# Patient Record
Sex: Female | Born: 1990 | Race: Asian | Hispanic: No | Marital: Single | Smoking: Never smoker
Health system: Southern US, Community
[De-identification: ages and names within clinical notes are randomized; demographics above are authoritative.]

---
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
## 2014-08-04 ENCOUNTER — Emergency Department (HOSPITAL_BASED_OUTPATIENT_CLINIC_OR_DEPARTMENT_OTHER)
Admission: EM | Admit: 2014-08-04 | Discharge: 2014-08-04 | Disposition: A | Discharge status: Home / Self Care | Payer: BC Managed Care – PPO | Attending: Emergency Medicine | Admitting: Emergency Medicine

## 2014-08-04 ENCOUNTER — Emergency Department (HOSPITAL_BASED_OUTPATIENT_CLINIC_OR_DEPARTMENT_OTHER): Payer: BC Managed Care – PPO

## 2014-08-04 ENCOUNTER — Encounter (HOSPITAL_BASED_OUTPATIENT_CLINIC_OR_DEPARTMENT_OTHER): Payer: Self-pay | Admitting: Emergency Medicine

## 2014-08-04 DIAGNOSIS — M545 Low back pain, unspecified: Secondary | ICD-10-CM | POA: Insufficient documentation

## 2014-08-04 DIAGNOSIS — Z79899 Other long term (current) drug therapy: Secondary | ICD-10-CM | POA: Diagnosis not present

## 2014-08-04 DIAGNOSIS — Z3202 Encounter for pregnancy test, result negative: Secondary | ICD-10-CM | POA: Diagnosis not present

## 2014-08-04 DIAGNOSIS — M5430 Sciatica, unspecified side: Secondary | ICD-10-CM | POA: Diagnosis not present

## 2014-08-04 DIAGNOSIS — M5442 Lumbago with sciatica, left side: Secondary | ICD-10-CM

## 2014-08-04 DIAGNOSIS — M5441 Lumbago with sciatica, right side: Secondary | ICD-10-CM

## 2014-08-04 LAB — URINALYSIS, ROUTINE W REFLEX MICROSCOPIC
BILIRUBIN URINE: NEGATIVE
GLUCOSE, UA: NEGATIVE mg/dL
Hgb urine dipstick: NEGATIVE
Ketones, ur: NEGATIVE mg/dL
Leukocytes, UA: NEGATIVE
Nitrite: NEGATIVE
PH: 6.5 (ref 5.0–8.0)
Protein, ur: NEGATIVE mg/dL
SPECIFIC GRAVITY, URINE: 1.016 (ref 1.005–1.030)
UROBILINOGEN UA: 0.2 mg/dL (ref 0.0–1.0)

## 2014-08-04 LAB — PREGNANCY, URINE: PREG TEST UR: NEGATIVE

## 2014-08-04 MED ORDER — IBUPROFEN 800 MG PO TABS: 800.0000 mg | ORAL_TABLET | Freq: Three times a day (TID) | ORAL | Status: AC | PRN

## 2014-08-04 MED ORDER — CYCLOBENZAPRINE HCL 10 MG PO TABS: 10.0000 mg | ORAL_TABLET | Freq: Three times a day (TID) | ORAL | Status: AC | PRN

## 2014-08-04 MED ORDER — OXYCODONE-ACETAMINOPHEN 5-325 MG PO TABS
2.0000 | ORAL_TABLET | Freq: Once | ORAL | Status: AC
  Administered 2014-08-04: 2 via ORAL
  Filled 2014-08-04: qty 2

## 2014-08-04 MED ORDER — OXYCODONE-ACETAMINOPHEN 5-325 MG PO TABS: 1.0000 | ORAL_TABLET | ORAL | Status: AC | PRN

## 2014-08-04 MED ORDER — HYDROMORPHONE HCL 1 MG/ML IJ SOLN
1.0000 mg | Freq: Once | INTRAMUSCULAR | Status: AC
  Administered 2014-08-04: 1 mg via INTRAVENOUS
  Filled 2014-08-04: qty 1

## 2014-08-04 MED ORDER — CYCLOBENZAPRINE HCL 10 MG PO TABS
10.0000 mg | ORAL_TABLET | Freq: Once | ORAL | Status: AC
  Administered 2014-08-04: 10 mg via ORAL
  Filled 2014-08-04: qty 1

## 2014-08-04 NOTE — ED Notes (Signed)
Patient here with severe lower back pain radiating down both legs, denies injury but reports that the pain is preventing her from weight bearing.

## 2014-08-04 NOTE — Discharge Instructions (Signed)
Back Pain, Adult °Back pain is very common. The pain often gets better over time. The cause of back pain is usually not dangerous. Most people can learn to manage their back pain on their own.  °HOME CARE  °· Stay active. Start with short walks on flat ground if you can. Try to walk farther each day. °· Do not sit, drive, or stand in one place for more than 30 minutes. Do not stay in bed. °· Do not avoid exercise or work. Activity can help your back heal faster. °· Be careful when you bend or lift an object. Bend at your knees, keep the object close to you, and do not twist. °· Sleep on a firm mattress. Lie on your side, and bend your knees. If you lie on your back, put a pillow under your knees. °· Only take medicines as told by your doctor. °· Put ice on the injured area. °¨ Put ice in a plastic bag. °¨ Place a towel between your skin and the bag. °¨ Leave the ice on for 15-20 minutes, 03-04 times a day for the first 2 to 3 days. After that, you can switch between ice and heat packs. °· Ask your doctor about back exercises or massage. °· Avoid feeling anxious or stressed. Find good ways to deal with stress, such as exercise. °GET HELP RIGHT AWAY IF:  °· Your pain does not go away with rest or medicine. °· Your pain does not go away in 1 week. °· You have new problems. °· You do not feel well. °· The pain spreads into your legs. °· You cannot control when you poop (bowel movement) or pee (urinate). °· Your arms or legs feel weak or lose feeling (numbness). °· You feel sick to your stomach (nauseous) or throw up (vomit). °· You have belly (abdominal) pain. °· You feel like you may pass out (faint). °MAKE SURE YOU:  °· Understand these instructions. °· Will watch your condition. °· Will get help right away if you are not doing well or get worse. °Document Released: 04/20/2008 Document Revised: 01/25/2012 Document Reviewed: 03/06/2014 °[employer]® Patient Information ©2015 [employer]. This information is not intended  to replace advice given to you by your health care provider. Make sure you discuss any questions you have with your health care provider. ° °

## 2014-08-04 NOTE — ED Provider Notes (Signed)
CSN: [id number]     Arrival date & time 08/04/14  1223 History   First MD Initiated Contact with Patient 08/04/14 1318     Chief Complaint  Patient presents with  . Back Pain     (Consider location/radiation/quality/duration/timing/severity/associated sxs/prior Treatment) HPI 23 year old female presents with back pain. She states that it started a couple weeks ago when she thinks she may have twisted. Since then his been aggressively worse. This morning she thinks something popped and she was unable to stand up due to the severity of the pain. There's been no weakness or numbness in her legs but the pain was unbearable to stand. She currently rates the pain as a severe pain. Is localized in the lower back and radiates down both thighs area. It goes about mid thigh. Denies any saddle anesthesia, I'll or bladder incontinence, or fevers. She's been taking Tylenol without any relief.  History reviewed. No pertinent past medical history. History reviewed. No pertinent past surgical history. No family history on file. History  Substance Use Topics  . Smoking status: Never Smoker   . Smokeless tobacco: Not on file  . Alcohol Use: Not on file   OB History   Grav Para Term Preterm Abortions TAB SAB Ect Mult Living                 Review of Systems  Constitutional: Negative for fever.  Gastrointestinal: Negative for abdominal pain.  Genitourinary: Negative for dysuria and hematuria.  Musculoskeletal: Positive for back pain.  Neurological: Negative for weakness and numbness.  All other systems reviewed and are negative.     Allergies  Hydrocodone  Home Medications   Prior to Admission medications   Medication Sig Start Date End Date Taking? Authorizing Provider  clonazePAM (KLONOPIN) 1 MG tablet Take 1 mg by mouth 2 (two) times daily.   Yes Historical Provider, MD  sertraline (ZOLOFT) 25 MG tablet Take 25 mg by mouth daily.   Yes Historical Provider, MD   BP 133/74  Pulse 83   Temp(Src) 98.1 F (36.7 C) (Oral)  Resp 20  Ht  (1.6 m)  Wt 200 lb (90.719 kg)  BMI 35.44 kg/m2  SpO2 100%  LMP 07/21/2014 Physical Exam  Nursing note and vitals reviewed. Constitutional: She is oriented to person, place, and time. She appears well-developed and well-nourished.  HENT:  Head: Normocephalic and atraumatic.  Right Ear: External ear normal.  Left Ear: External ear normal.  Nose: Nose normal.  Eyes: Right eye exhibits no discharge. Left eye exhibits no discharge.  Cardiovascular: Normal rate, regular rhythm and normal heart sounds.   Pulmonary/Chest: Effort normal and breath sounds normal.  Abdominal: Soft. She exhibits no distension. There is no tenderness. There is no CVA tenderness.  Musculoskeletal:       Lumbar back: She exhibits tenderness (diffuse midline and paraspinal tenderness).  Neurological: She is alert and oriented to person, place, and time. She displays no Babinski's sign on the right side. She displays no Babinski's sign on the left side.  Reflex Scores:      Patellar reflexes are 2+ on the right side and 2+ on the left side.      Achilles reflexes are 2+ on the right side and 2+ on the left side. 5/5 strength in bilateral lower extremities. Normal sensation bilaterally  Skin: Skin is warm and dry.    ED Course  Procedures (including critical care time) Labs Review Labs Reviewed  PREGNANCY, URINE  URINALYSIS, ROUTINE W REFLEX  MICROSCOPIC    Imaging Review Dg Lumbar Spine Complete  08/04/2014   CLINICAL DATA:  Low back pain for the past 2 weeks. No known injury.  EXAM: LUMBAR SPINE - COMPLETE 4+ VIEW  COMPARISON:  None.  FINDINGS: Mild reversal of the normal lumbar lordosis. Five non-rib-bearing lumbar vertebrae. These have normal appearances with no fractures, pars defects or subluxations.  IMPRESSION: Mild reversal of the normal lumbar lordosis. Otherwise, normal examination.   Electronically Signed   By: Gordan Payment M.D.   On: 08/04/2014  14:58     EKG Interpretation None      MDM   Final diagnoses:  Bilateral low back pain with sciatica, sciatica laterality unspecified    Patient's pain significant improved with one dose of IV pain medicine. She was able to get up and walk around without assistance. Given this and her neurologic exam I have low suspicion for acute spinal cord emergency. Her back x-ray shows no fractures. There is likely a muscular strain and spasm component. Will treat with pain meds, NSAIDs, and antispasmodics. Discussed strict return precautions.    Audree Camel, MD 08/04/14 [phone]

## 2015-11-11 IMAGING — CR DG LUMBAR SPINE COMPLETE 4+V
5 series · 5 of 5 positions shown · non-contrast
Comparison: None.

CLINICAL DATA: Low back pain for the past 2 weeks. No known injury.

EXAM:
LUMBAR SPINE - COMPLETE 4+ VIEW

[t l-spine a.p.]
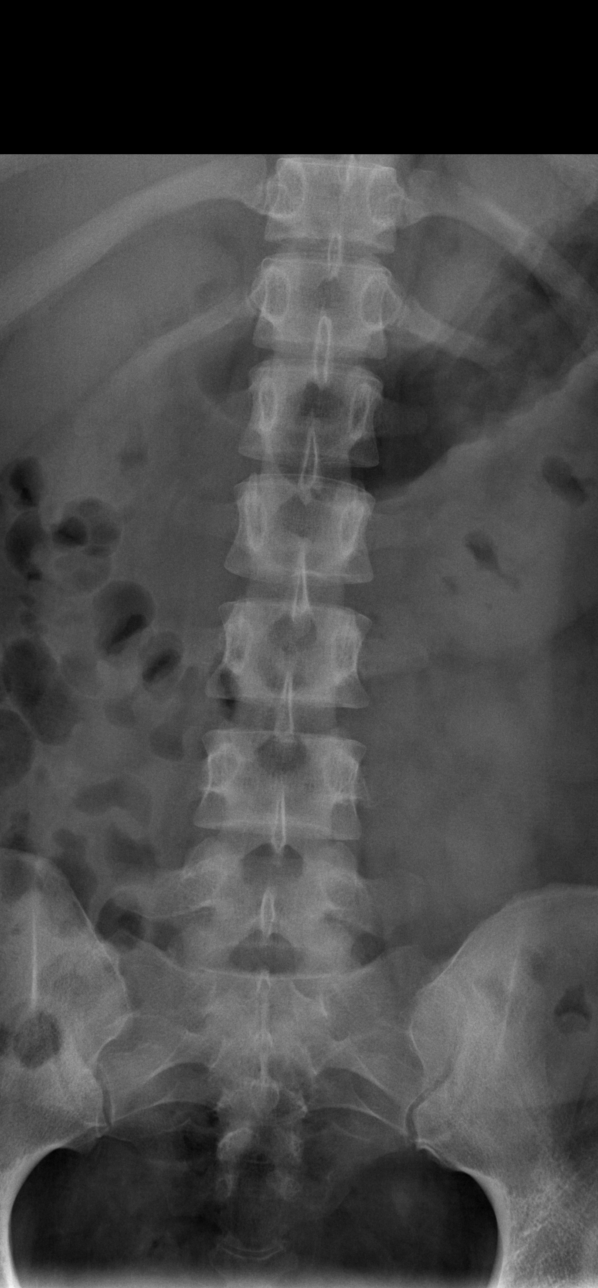

[t l-spine oblique exposure (1 of 2)]
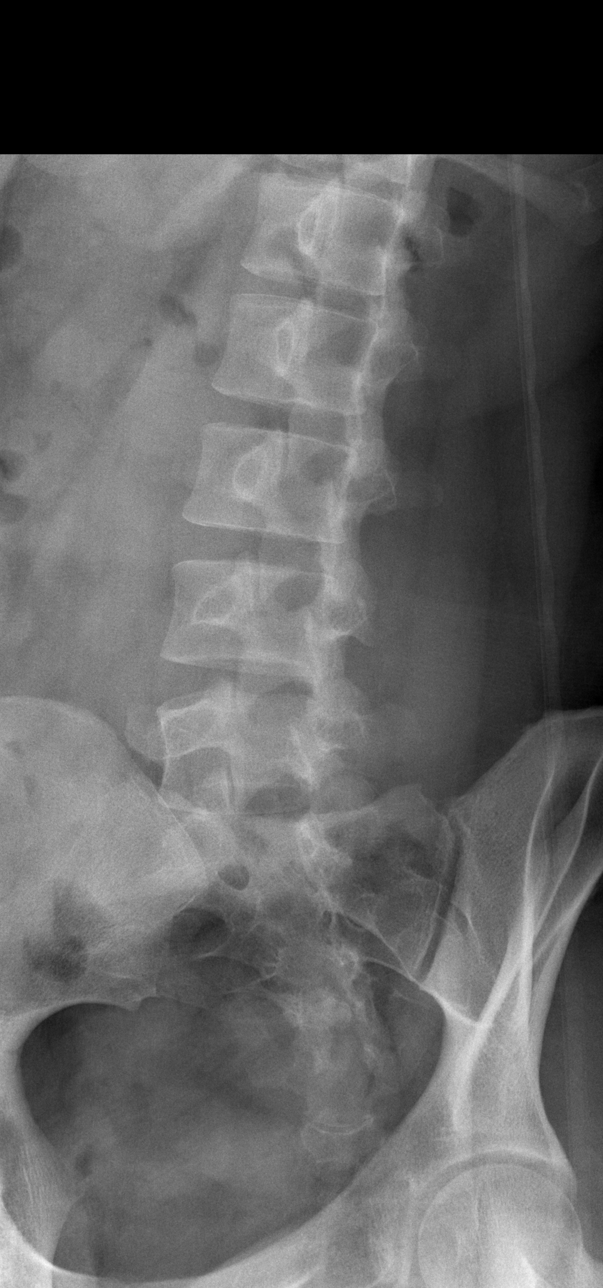

[t l-spine oblique exposure (2 of 2)]
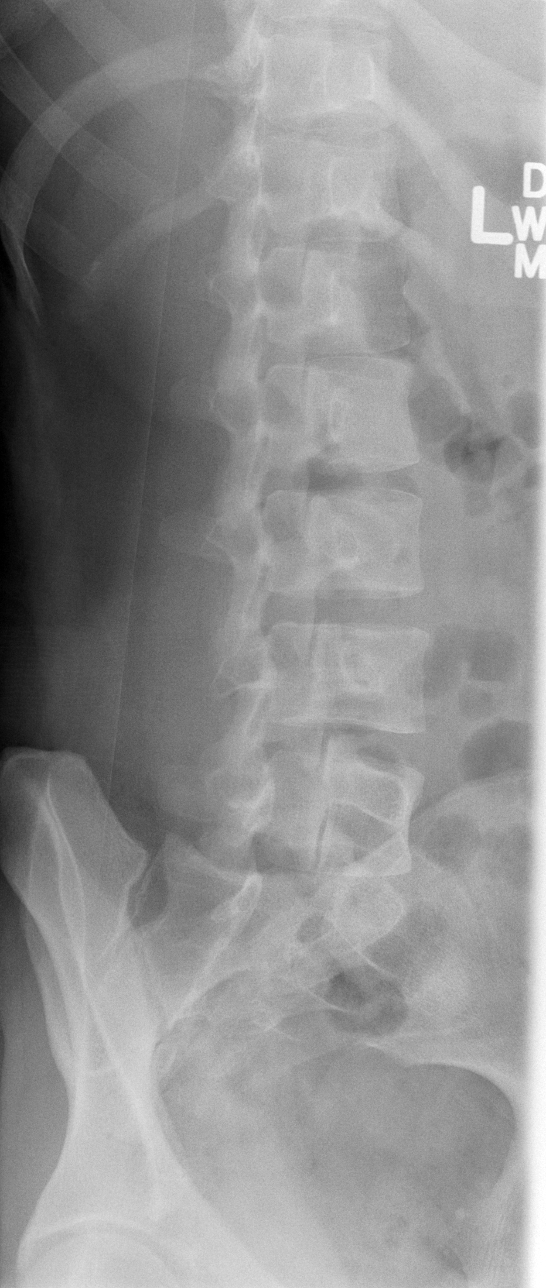

[t l-spine lat]
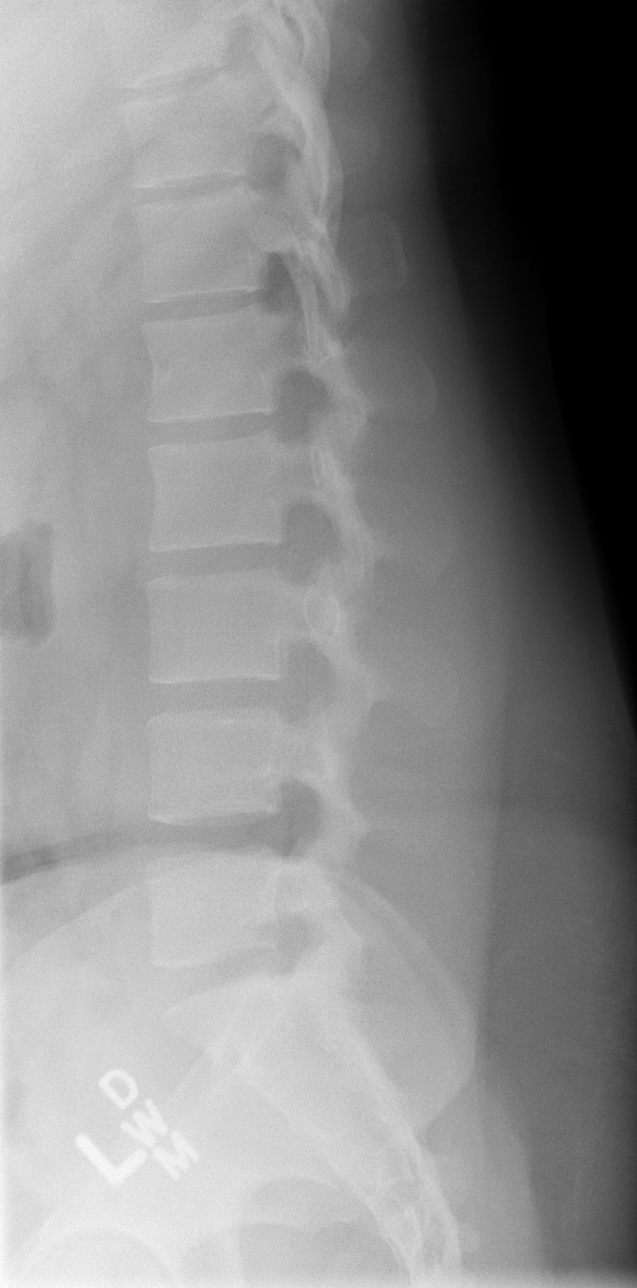

[t l-spine l5-s1 spot]
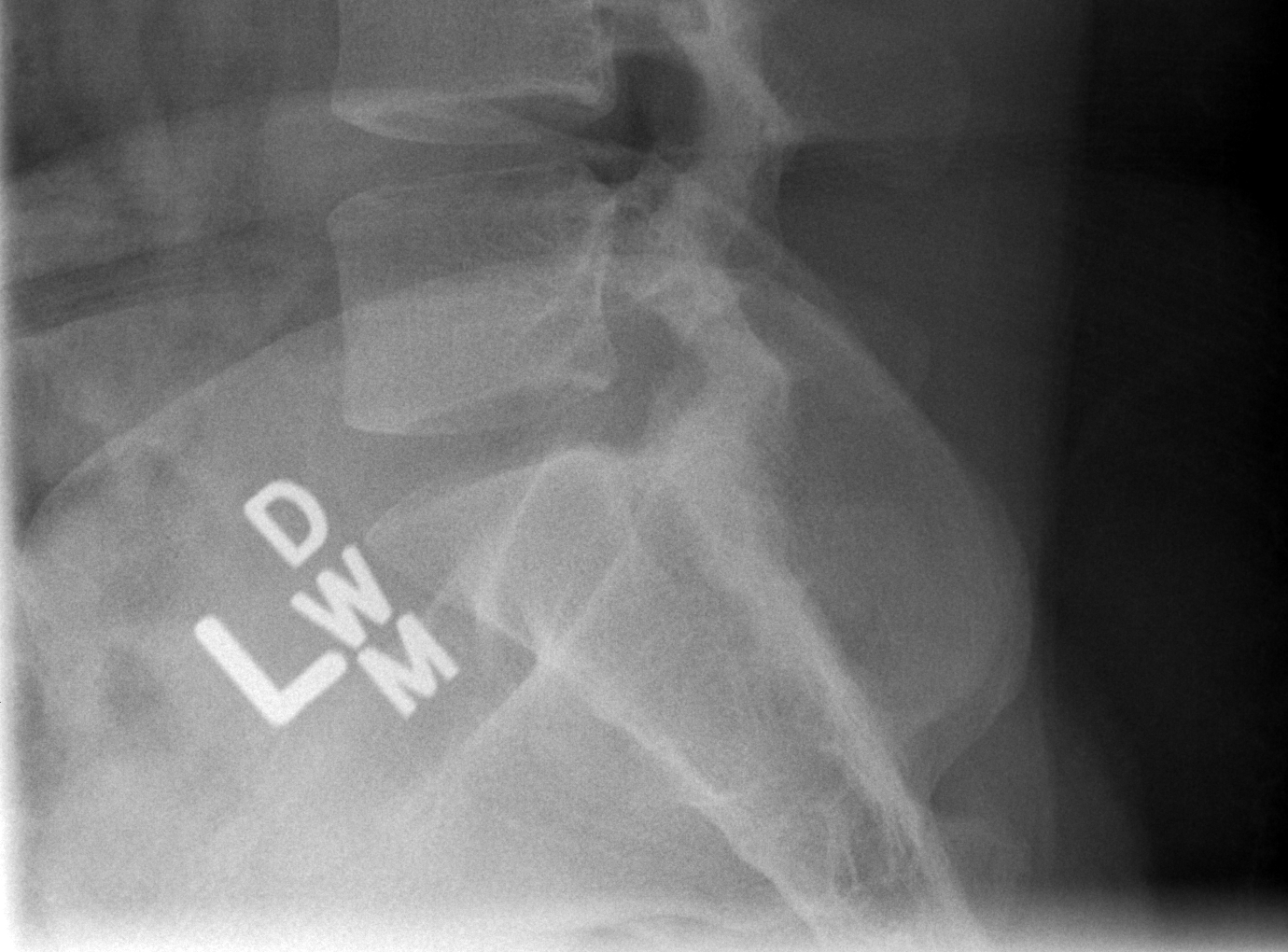

[5 of 5 positions shown; findings below may reference images not displayed]

FINDINGS: Mild reversal of the normal lumbar lordosis. Five non-rib-bearing
lumbar vertebrae. These have normal appearances with no fractures,
pars defects or subluxations.
IMPRESSION: Mild reversal of the normal lumbar lordosis. Otherwise, normal
examination.
